# Patient Record
Sex: Male | Born: 1955 | Race: Black or African American | Hispanic: No | Marital: Single | Smoking: Light tobacco smoker
Health system: Southern US, Community
[De-identification: ages and names within clinical notes are randomized; demographics above are authoritative.]

## PROBLEM LIST (undated history)

## (undated) DIAGNOSIS — E785 Hyperlipidemia, unspecified: Secondary | ICD-10-CM

## (undated) DIAGNOSIS — I10 Essential (primary) hypertension: Secondary | ICD-10-CM

## (undated) DIAGNOSIS — E119 Type 2 diabetes mellitus without complications: Secondary | ICD-10-CM

---

## 2017-12-09 ENCOUNTER — Emergency Department: Payer: Managed Care, Other (non HMO)

## 2017-12-09 ENCOUNTER — Other Ambulatory Visit: Payer: Self-pay

## 2017-12-09 ENCOUNTER — Emergency Department
Admission: EM | Admit: 2017-12-09 | Discharge: 2017-12-10 | Disposition: A | Discharge status: Home / Self Care | Payer: Managed Care, Other (non HMO) | Attending: Emergency Medicine | Admitting: Emergency Medicine

## 2017-12-09 DIAGNOSIS — T671XXA Heat syncope, initial encounter: Secondary | ICD-10-CM | POA: Insufficient documentation

## 2017-12-09 DIAGNOSIS — F1210 Cannabis abuse, uncomplicated: Secondary | ICD-10-CM | POA: Diagnosis not present

## 2017-12-09 DIAGNOSIS — E860 Dehydration: Secondary | ICD-10-CM | POA: Insufficient documentation

## 2017-12-09 DIAGNOSIS — I10 Essential (primary) hypertension: Secondary | ICD-10-CM | POA: Diagnosis not present

## 2017-12-09 DIAGNOSIS — Z79899 Other long term (current) drug therapy: Secondary | ICD-10-CM | POA: Diagnosis not present

## 2017-12-09 DIAGNOSIS — E119 Type 2 diabetes mellitus without complications: Secondary | ICD-10-CM | POA: Insufficient documentation

## 2017-12-09 DIAGNOSIS — R55 Syncope and collapse: Secondary | ICD-10-CM | POA: Diagnosis present

## 2017-12-09 DIAGNOSIS — Z7984 Long term (current) use of oral hypoglycemic drugs: Secondary | ICD-10-CM | POA: Diagnosis not present

## 2017-12-09 LAB — BASIC METABOLIC PANEL
Anion gap: 10 (ref 5–15)
BUN: 23 mg/dL (ref 8–23)
CALCIUM: 9 mg/dL (ref 8.9–10.3)
CHLORIDE: 107 mmol/L (ref 98–111)
CO2: 23 mmol/L (ref 22–32)
CREATININE: 1.48 mg/dL — AB (ref 0.61–1.24)
GFR calc non Af Amer: 49 mL/min — ABNORMAL LOW (ref >60)
GFR, EST AFRICAN AMERICAN: 56 mL/min — AB (ref >60)
Glucose, Bld: 167 mg/dL — ABNORMAL HIGH (ref 70–99)
Potassium: 3.4 mmol/L — ABNORMAL LOW (ref 3.5–5.1)
Sodium: 140 mmol/L (ref 135–145)

## 2017-12-09 LAB — URINE DRUG SCREEN, QUALITATIVE (ARMC ONLY)
AMPHETAMINES, UR SCREEN: NOT DETECTED
Benzodiazepine, Ur Scrn: NOT DETECTED
COCAINE METABOLITE, UR ~~LOC~~: NOT DETECTED
Cannabinoid 50 Ng, Ur ~~LOC~~: POSITIVE — AB
MDMA (ECSTASY) UR SCREEN: NOT DETECTED
METHADONE SCREEN, URINE: NOT DETECTED
Opiate, Ur Screen: NOT DETECTED
Phencyclidine (PCP) Ur S: NOT DETECTED
TRICYCLIC, UR SCREEN: NOT DETECTED

## 2017-12-09 LAB — CBC
HCT: 37.5 % — ABNORMAL LOW (ref 40.0–52.0)
Hemoglobin: 13.1 g/dL (ref 13.0–18.0)
MCH: 31.7 pg (ref 26.0–34.0)
MCHC: 34.9 g/dL (ref 32.0–36.0)
MCV: 90.8 fL (ref 80.0–100.0)
PLATELETS: 184 10*3/uL (ref 150–440)
RBC: 4.13 MIL/uL — ABNORMAL LOW (ref 4.40–5.90)
RDW: 13.7 % (ref 11.5–14.5)
WBC: 7 10*3/uL (ref 3.8–10.6)

## 2017-12-09 LAB — URINALYSIS, COMPLETE (UACMP) WITH MICROSCOPIC
Bacteria, UA: NONE SEEN
Bilirubin Urine: NEGATIVE
GLUCOSE, UA: NEGATIVE mg/dL
Hgb urine dipstick: NEGATIVE
Ketones, ur: NEGATIVE mg/dL
Leukocytes, UA: NEGATIVE
Nitrite: NEGATIVE
PH: 5 (ref 5.0–8.0)
Protein, ur: NEGATIVE mg/dL
SPECIFIC GRAVITY, URINE: 1.01 (ref 1.005–1.030)

## 2017-12-09 LAB — CK: CK TOTAL: 461 U/L — AB (ref 49–397)

## 2017-12-09 LAB — TROPONIN I: TROPONIN I: 0.03 ng/mL — AB (ref <0.03)

## 2017-12-09 LAB — ETHANOL: Alcohol, Ethyl (B): <10 mg/dL (ref <10)

## 2017-12-09 MED ORDER — SODIUM CHLORIDE 0.9 % IV BOLUS
1000.0000 mL | Freq: Once | INTRAVENOUS | Status: AC
  Administered 2017-12-09: 1000 mL via INTRAVENOUS

## 2017-12-09 NOTE — ED Provider Notes (Signed)
[facility] Emergency Department Provider Note   ____________________________________________   First MD Initiated Contact with Patient 12/09/17 2250     (approximate)  I have reviewed the triage vital signs and the nursing notes.   HISTORY  Chief Complaint Loss of Consciousness    HPI David Werner is a 62 y.o. male brought to the ED from home via EMS with a chief complaint of syncope.  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  At 7 AM he mowed his lawn with a riding lawnmower.  Took a shower and then played 9 holes of golf in the heat of the afternoon.  Went home and had a beer and 2 glasses of wine and started the grill.  He was sitting down when he felt lightheaded and dizzy accompanied by nausea.  He stood up to go indoors into the air conditioning and had a brief syncopal episode.  His family members managed to catch him so he did not strike his head.  Denies associated chest pain, shortness of breath, abdominal pain, vomiting, diarrhea.  EMS reports patient was orthostatic on their arrival.  Patient denies anticoagulant use.  Denies recent travel or trauma.   Past medical history Hypertension Hyperlipidemia Diabetes GERD  There are no active problems to display for this patient.    Prior to Admission medications   Medication Sig Start Date End Date Taking? Authorizing Provider  gabapentin (NEURONTIN) 400 MG capsule Take 400 mg by mouth 5 (five) times daily.   Yes [provider]  ibuprofen (ADVIL,MOTRIN) 800 MG tablet Take 800 mg by mouth 3 (three) times daily as needed for pain.   Yes [provider]  metFORMIN (GLUCOPHAGE) 500 MG tablet Take 500 mg by mouth daily with breakfast.   Yes [provider]  olmesartan (BENICAR) 20 MG tablet Take 20 mg by mouth daily.   Yes [provider]  omeprazole (PRILOSEC) 20 MG capsule Take 20 mg by mouth  daily.   Yes [provider]  rosuvastatin (CRESTOR) 5 MG tablet Take 5 mg by mouth at bedtime.   Yes [provider]    Allergies Patient has no known allergies.  No family history on file.  Social History Social History   Tobacco Use  . Smoking status: Not on file  Substance Use Topics  . Alcohol use: Not on file  . Drug use: Not on file  + EtOH + Marijuana use this evening  Review of Systems  Constitutional: No fever/chills Eyes: No visual changes. ENT: No sore throat. Cardiovascular: Denies chest pain. Respiratory: Denies shortness of breath. Gastrointestinal: No abdominal pain.  No nausea, no vomiting.  No diarrhea.  No constipation. Genitourinary: Negative for dysuria. Musculoskeletal: Negative for back pain. Skin: Negative for rash. Neurological: Positive for syncope.  Negative for headaches, focal weakness or numbness.   ____________________________________________   PHYSICAL EXAM:  VITAL SIGNS: ED Triage Vitals  Enc Vitals Group     BP 12/09/17 2126 (!) 110/59     Pulse Rate 12/09/17 2126 64     Resp 12/09/17 2126 18     Temp 12/09/17 2126 98.2 F (36.8 C)     Temp Source 12/09/17 2126 Oral     SpO2 12/09/17 2126 98 %     Weight 12/09/17 2127 240 lb (108.9 kg)     Height 12/09/17 2127 6\' 3"  (1.905 m)     Head Circumference --  Peak Flow --      Pain Score 12/09/17 2127 0     Pain Loc --      Pain Edu? --      Excl. in GC? --     Constitutional: Alert and oriented. Well appearing and in no acute distress.  Pleasant. Eyes: Conjunctivae are normal. PERRL. EOMI. Head: Atraumatic. Nose: No congestion/rhinnorhea. Mouth/Throat: Mucous membranes are moist.  Oropharynx non-erythematous. Neck: No stridor.  No cervical spine tenderness to palpation.  No carotid bruits.  Supple neck without meningismus. Cardiovascular: Normal rate, regular rhythm. Grossly normal heart sounds.  Good peripheral circulation. Respiratory: Normal  respiratory effort.  No retractions. Lungs CTAB. Gastrointestinal: Soft and nontender. No distention. No abdominal bruits. No CVA tenderness. Musculoskeletal: No lower extremity tenderness nor edema.  No joint effusions. Neurologic:  Normal speech and language. No gross focal neurologic deficits are appreciated.  Skin:  Skin is warm, dry and intact. No rash noted. Psychiatric: Mood and affect are normal. Speech and behavior are normal.  ____________________________________________   LABS (all labs ordered are listed, but only abnormal results are displayed)  Labs Reviewed  BASIC METABOLIC PANEL - Abnormal; Notable for the following components:      Result Value   Potassium 3.4 (*)    Glucose, Bld 167 (*)    Creatinine, Ser 1.48 (*)    GFR calc non Af Amer 49 (*)    GFR calc Af Amer 56 (*)    All other components within normal limits  CBC - Abnormal; Notable for the following components:   RBC 4.13 (*)    HCT 37.5 (*)    All other components within normal limits  URINALYSIS, COMPLETE (UACMP) WITH MICROSCOPIC - Abnormal; Notable for the following components:   Color, Urine YELLOW (*)    APPearance CLEAR (*)    All other components within normal limits  TROPONIN I - Abnormal; Notable for the following components:   Troponin I 0.03 (*)    All other components within normal limits  URINE DRUG SCREEN, QUALITATIVE ([facility] ONLY) - Abnormal; Notable for the following components:   Cannabinoid 50 Ng, Ur Palmer POSITIVE (*)    Barbiturates, Ur Screen   (*)    Value: Result not available. Reagent lot number recalled by manufacturer.   All other components within normal limits  CK - Abnormal; Notable for the following components:   Total CK 461 (*)    All other components within normal limits  TROPONIN I - Abnormal; Notable for the following components:   Troponin I 0.03 (*)    All other components within normal limits  ETHANOL   ____________________________________________  EKG  ED ECG  REPORT I, Jenetta Wease J, the attending physician, personally viewed and interpreted this ECG.   Date: 12/10/2017  EKG Time: 2126  Rate: 66  Rhythm: normal EKG, normal sinus rhythm  Axis: Normal  Intervals:none  ST&T Change: Nonspecific  ____________________________________________  RADIOLOGY  ED MD interpretation: CT head demonstrates no ICH;   Official radiology report(s): Ct Head Wo Contrast  Result Date: 12/10/2017 CLINICAL DATA:  Syncope and hypotension EXAM: CT HEAD WITHOUT CONTRAST TECHNIQUE: Contiguous axial images were obtained from the base of the skull through the vertex without intravenous contrast. COMPARISON:  None. FINDINGS: Brain: There is no mass, hemorrhage or extra-axial collection. The size and configuration of the ventricles and extra-axial CSF spaces are normal. There is no acute or chronic infarction. The brain parenchyma is normal. Vascular: No abnormal hyperdensity of the major intracranial  arteries or dural venous sinuses. No intracranial atherosclerosis. Skull: The visualized skull base, calvarium and extracranial soft tissues are normal. Sinuses/Orbits: No fluid levels or advanced mucosal thickening of the visualized paranasal sinuses. No mastoid or middle ear effusion. The orbits are normal. IMPRESSION: Normal head CT. Electronically Signed   By: Deatra Robinson M.D.   On: 12/10/2017 00:01   Dg Chest Port 1 View  Result Date: 12/10/2017 CLINICAL DATA:  Syncope EXAM: PORTABLE CHEST 1 VIEW COMPARISON:  None. FINDINGS: The heart size and mediastinal contours are within normal limits. Both lungs are clear. The visualized skeletal structures are unremarkable. IMPRESSION: No active disease. Electronically Signed   By: Deatra Robinson M.D.   On: 12/10/2017 00:12    ____________________________________________   PROCEDURES  Procedure(s) performed: None  Procedures  Critical Care performed: No  ____________________________________________   INITIAL IMPRESSION /  ASSESSMENT AND PLAN / ED COURSE  As part of my medical decision making, I reviewed the following data within the electronic MEDICAL RECORD NUMBER Nursing notes reviewed and incorporated, Labs reviewed, EKG interpreted, Old chart reviewed, Radiograph reviewed and Notes from prior ED visits   62 year old male with diabetes, hypertension, hyperlipidemia who presents with syncopal episode in the setting of heat illness.  Differential diagnosis includes but is not limited to CVA, CAD, MI, dehydration, orthostasis, heat syncope, etc.  Initial laboratory urinalysis results remarkable for dehydration, borderline troponin, positive cannabinoids in urine.  CK is minimally elevated.  EtOH is negative.  Patient feels significantly better and had normal orthostatics after 1.5 L IV normal saline.  Will obtain CT head, chest x-ray, repeat troponin and reassess.  Clinical Course as of Dec 11 455  Sun Dec 10, 2017  0117 Repeat troponin unchanged.  Updated patient of CT and x-ray results.  He is overall feeling significantly better and very eager to be discharged home.  Strict return precautions given.  Patient verbalizes understanding and agrees with plan of care.   [JS]    Clinical Course User Index [JS] Irean Hong, MD     ____________________________________________   FINAL CLINICAL IMPRESSION(S) / ED DIAGNOSES  Final diagnoses:  Heat syncope, initial encounter  Dehydration  Marijuana abuse     ED Discharge Orders    None       Note:  This document was prepared using Dragon voice recognition software and may include unintentional dictation errors.    Irean Hong, MD 12/10/17 [phone]

## 2017-12-09 NOTE — ED Triage Notes (Signed)
EMS pt to rm 4 from home with report of syncopal episode this evening. Pt reports he mowed his yard this morning, then played 9 hole of golf. Went home and had two glasses of wine and was grilling out when he passed out by the grill. Denies injury. EMS gave 500 ml of ns bolus. Pt BP was 103/ while laying on the deck and then 89/ when he stood.

## 2017-12-10 LAB — TROPONIN I: Troponin I: 0.03 ng/mL (ref <0.03)

## 2017-12-10 NOTE — Discharge Instructions (Addendum)
1.  Stay in air conditioning and drink plenty of fluids this weekend. 2.  Return to the ER for worsening symptoms, persistent vomiting, difficulty breathing or other concerns.

## 2019-01-28 IMAGING — CT CT HEAD W/O CM
3 series · 16 of 47 positions shown, 19 images · non-contrast
Comparison: None.

CLINICAL DATA: Syncope and hypotension

EXAM:
CT HEAD WITHOUT CONTRAST
TECHNIQUE: Contiguous axial images were obtained from the base of the skull
through the vertex without intravenous contrast.

[Series 2: head wo · axial · 0.47mm/px · z∈[-142,-17]mm · 10 of 30 slices shown, 13 images]
[im 3/30  brain]
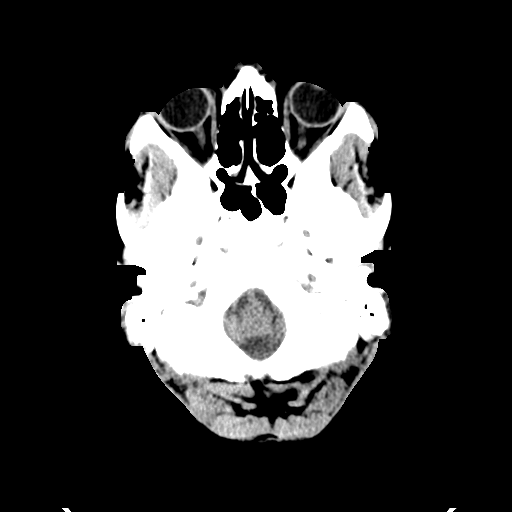
[im 3/30  bone]
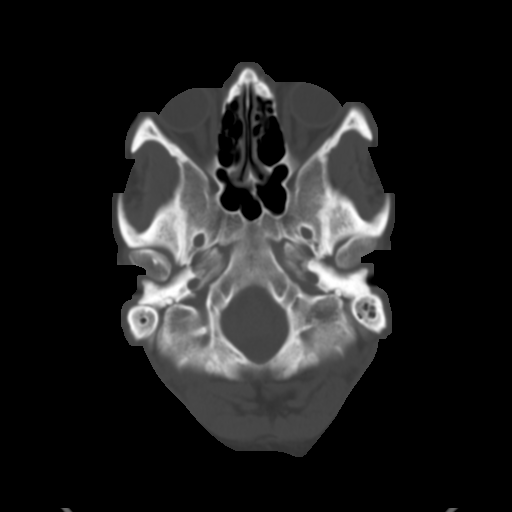
[im 6/30  brain]
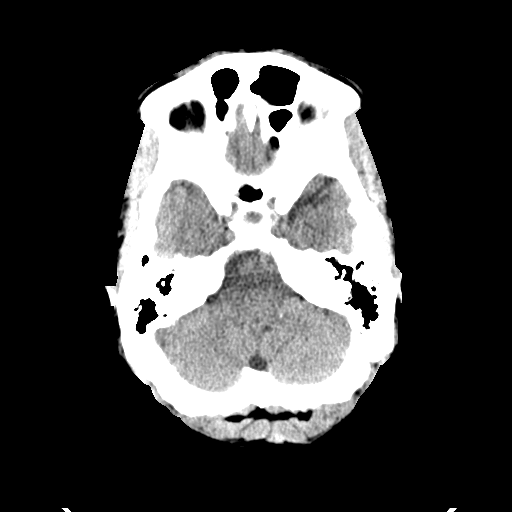
[im 9/30  brain]
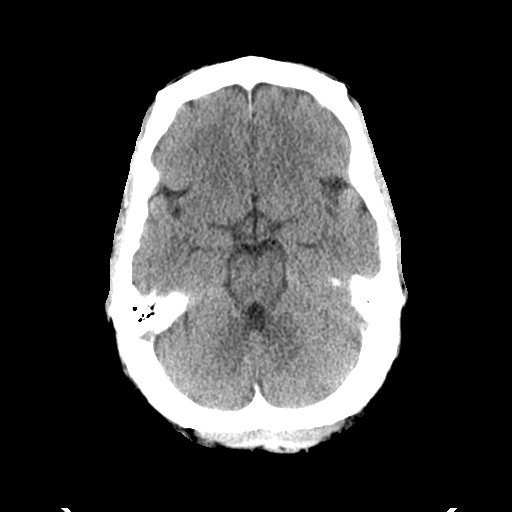
[im 11/30  brain]
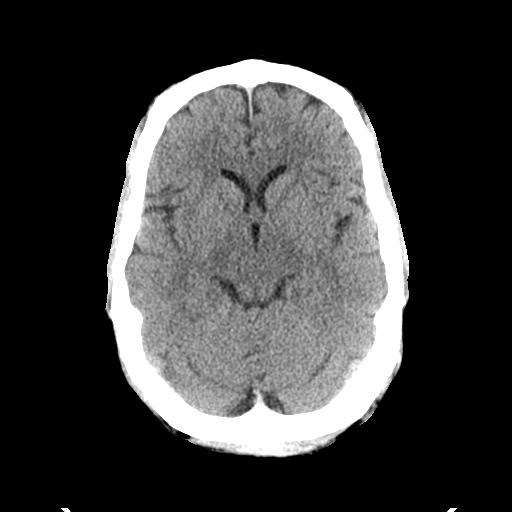
[im 14/30  brain]
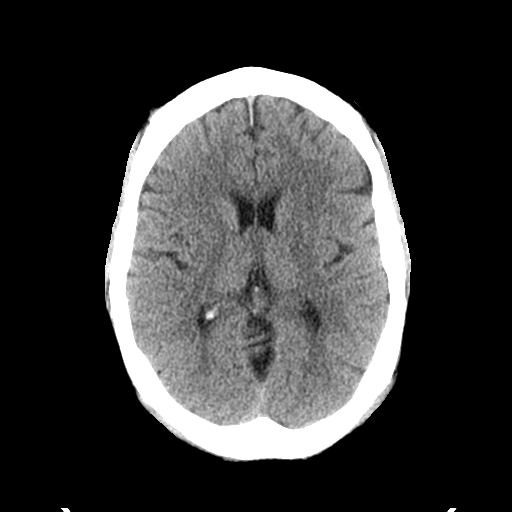
[im 14/30  bone]
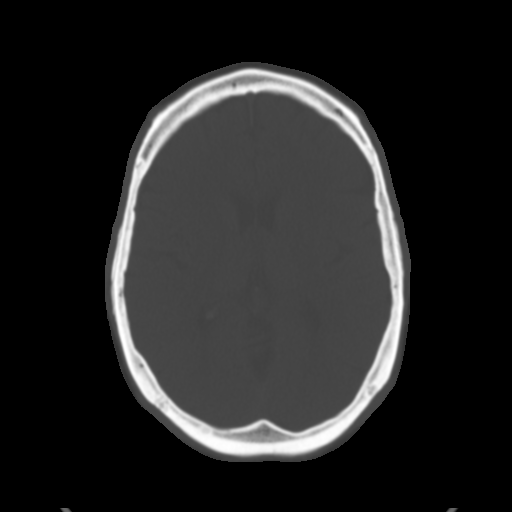
[im 17/30  brain]
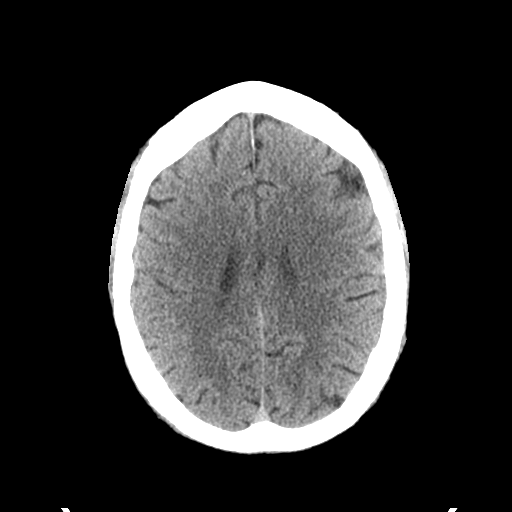
[im 20/30  brain]
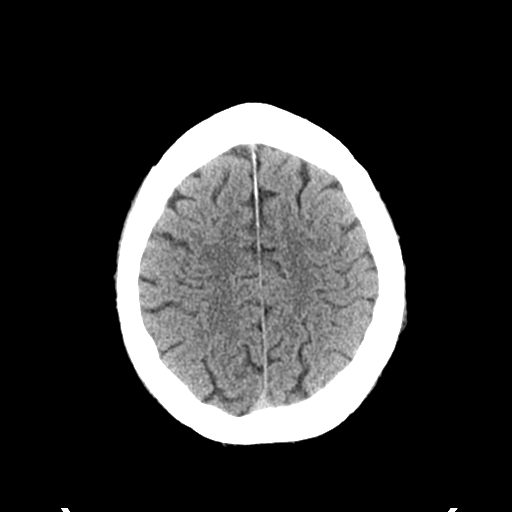
[im 23/30  brain]
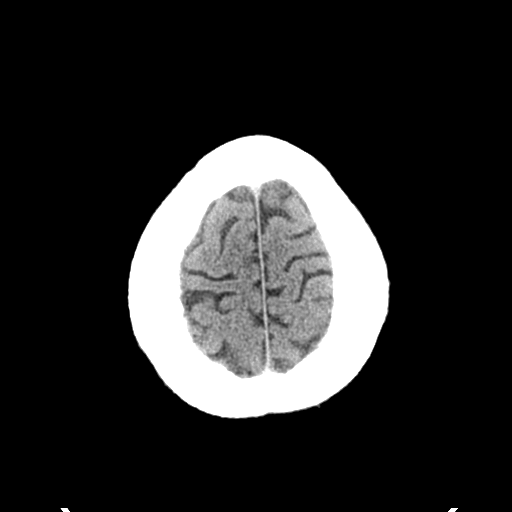
[im 25/30  brain]
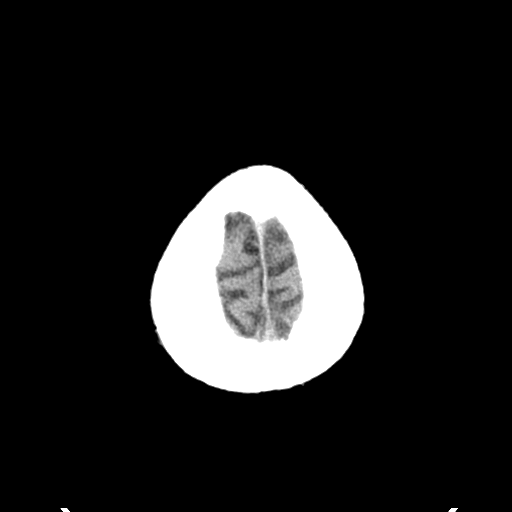
[im 25/30  bone]
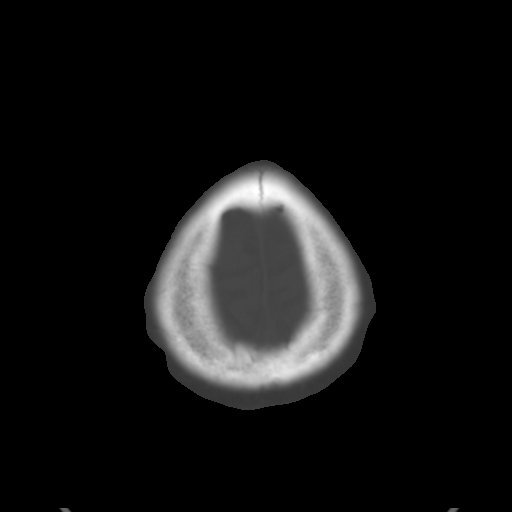
[im 28/30  brain]
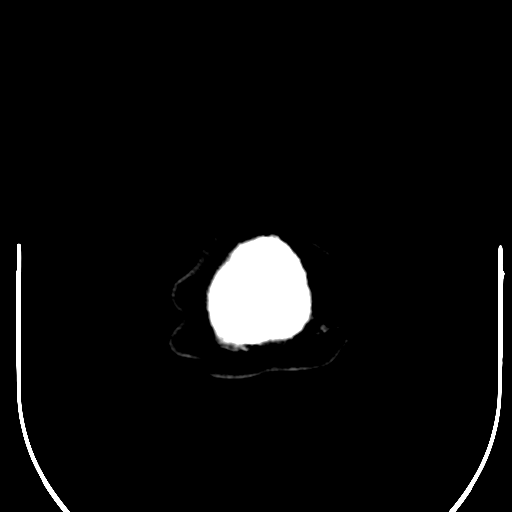

[Series 4: coronal soft tissue · coronal · 0.31mm/px · 3 of 68 slices shown]
[im 23/68  brain]
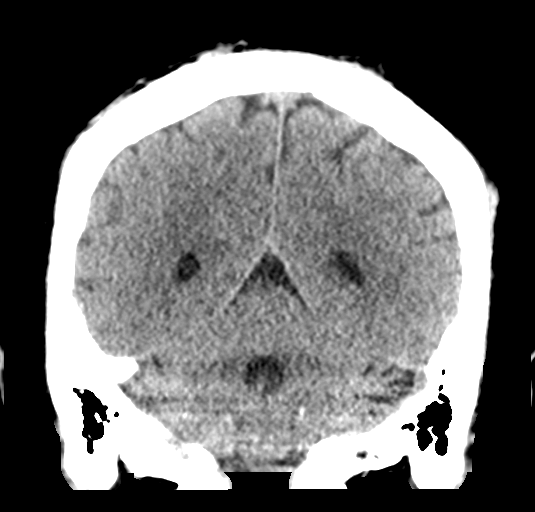
[im 30/68  brain]
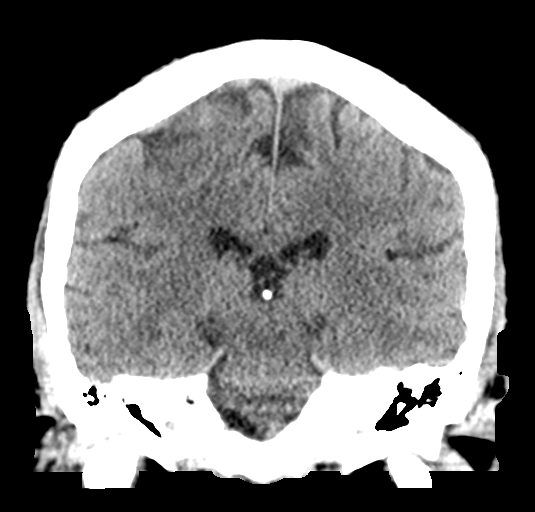
[im 38/68  brain]
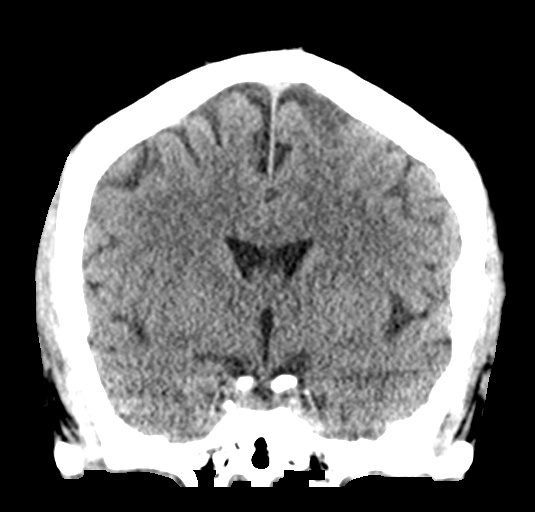

[Series 5: sagittal soft tissue · sagittal · 0.31mm/px · 3 of 55 slices shown]
[im 19/55  brain]
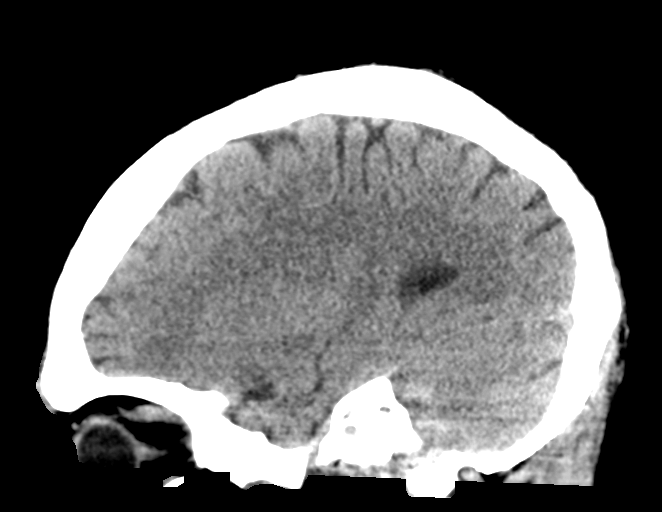
[im 28/55  brain]
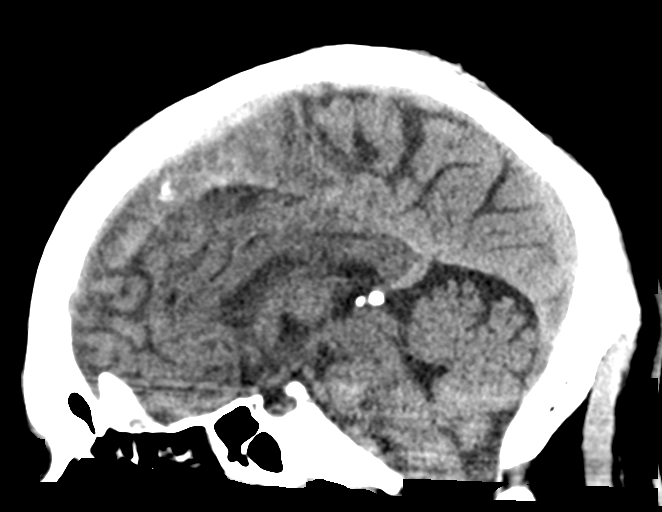
[im 37/55  brain]
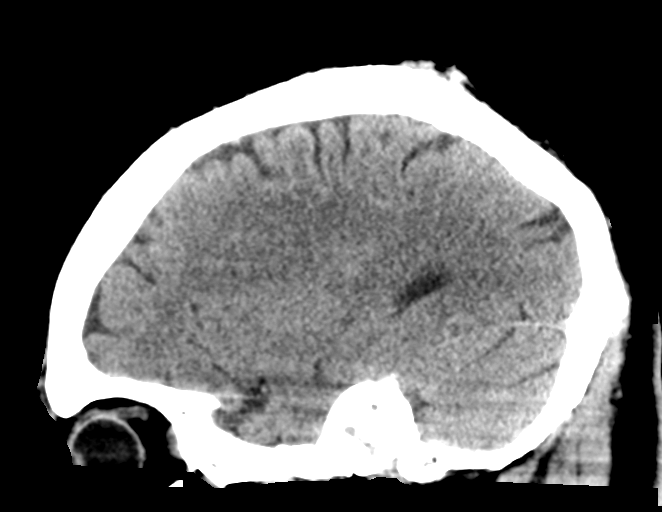

[16 of 47 positions shown; findings below may reference images not displayed]

FINDINGS: Brain: There is no mass, hemorrhage or extra-axial collection. The
size and configuration of the ventricles and extra-axial CSF spaces
are normal. There is no acute or chronic infarction. The brain
parenchyma is normal.

Vascular: No abnormal hyperdensity of the major intracranial
arteries or dural venous sinuses. No intracranial atherosclerosis.

Skull: The visualized skull base, calvarium and extracranial soft
tissues are normal.

Sinuses/Orbits: No fluid levels or advanced mucosal thickening of
the visualized paranasal sinuses. No mastoid or middle ear effusion.
The orbits are normal.
IMPRESSION: Normal head CT.

## 2019-07-28 ENCOUNTER — Other Ambulatory Visit: Payer: Self-pay

## 2019-07-28 ENCOUNTER — Encounter: Payer: Self-pay | Admitting: Emergency Medicine

## 2019-07-28 ENCOUNTER — Emergency Department: Payer: 59

## 2019-07-28 ENCOUNTER — Emergency Department
Admission: EM | Admit: 2019-07-28 | Discharge: 2019-07-29 | Disposition: A | Discharge status: Home / Self Care | Payer: 59 | Attending: Emergency Medicine | Admitting: Emergency Medicine

## 2019-07-28 DIAGNOSIS — R6883 Chills (without fever): Secondary | ICD-10-CM | POA: Insufficient documentation

## 2019-07-28 DIAGNOSIS — Z79899 Other long term (current) drug therapy: Secondary | ICD-10-CM | POA: Diagnosis not present

## 2019-07-28 DIAGNOSIS — R0602 Shortness of breath: Secondary | ICD-10-CM | POA: Diagnosis not present

## 2019-07-28 DIAGNOSIS — I10 Essential (primary) hypertension: Secondary | ICD-10-CM | POA: Insufficient documentation

## 2019-07-28 DIAGNOSIS — N3090 Cystitis, unspecified without hematuria: Secondary | ICD-10-CM | POA: Diagnosis not present

## 2019-07-28 DIAGNOSIS — R531 Weakness: Secondary | ICD-10-CM | POA: Insufficient documentation

## 2019-07-28 DIAGNOSIS — Z7984 Long term (current) use of oral hypoglycemic drugs: Secondary | ICD-10-CM | POA: Diagnosis not present

## 2019-07-28 DIAGNOSIS — F17200 Nicotine dependence, unspecified, uncomplicated: Secondary | ICD-10-CM | POA: Insufficient documentation

## 2019-07-28 DIAGNOSIS — Z20822 Contact with and (suspected) exposure to covid-19: Secondary | ICD-10-CM | POA: Insufficient documentation

## 2019-07-28 DIAGNOSIS — R110 Nausea: Secondary | ICD-10-CM

## 2019-07-28 DIAGNOSIS — E119 Type 2 diabetes mellitus without complications: Secondary | ICD-10-CM | POA: Insufficient documentation

## 2019-07-28 HISTORY — DX: Essential (primary) hypertension: I10

## 2019-07-28 HISTORY — DX: Hyperlipidemia, unspecified: E78.5

## 2019-07-28 HISTORY — DX: Type 2 diabetes mellitus without complications: E11.9

## 2019-07-28 LAB — HEPATIC FUNCTION PANEL
ALT: 40 U/L (ref 0–44)
AST: 30 U/L (ref 15–41)
Albumin: 3.8 g/dL (ref 3.5–5.0)
Alkaline Phosphatase: 49 U/L (ref 38–126)
Bilirubin, Direct: 0.1 mg/dL (ref 0.0–0.2)
Indirect Bilirubin: 0.6 mg/dL (ref 0.3–0.9)
Total Bilirubin: 0.7 mg/dL (ref 0.3–1.2)
Total Protein: 7.5 g/dL (ref 6.5–8.1)

## 2019-07-28 LAB — URINALYSIS, COMPLETE (UACMP) WITH MICROSCOPIC
Bacteria, UA: NONE SEEN
Bilirubin Urine: NEGATIVE
Glucose, UA: NEGATIVE mg/dL
Hgb urine dipstick: NEGATIVE
Ketones, ur: NEGATIVE mg/dL
Nitrite: NEGATIVE
Protein, ur: 30 mg/dL — AB
Specific Gravity, Urine: 1.017 (ref 1.005–1.030)
Squamous Epithelial / HPF: NONE SEEN (ref 0–5)
pH: 5 (ref 5.0–8.0)

## 2019-07-28 LAB — CBC
HCT: 39.4 % (ref 39.0–52.0)
Hemoglobin: 13.9 g/dL (ref 13.0–17.0)
MCH: 31.1 pg (ref 26.0–34.0)
MCHC: 35.3 g/dL (ref 30.0–36.0)
MCV: 88.1 fL (ref 80.0–100.0)
Platelets: 343 10*3/uL (ref 150–400)
RBC: 4.47 MIL/uL (ref 4.22–5.81)
RDW: 12.2 % (ref 11.5–15.5)
WBC: 7.3 10*3/uL (ref 4.0–10.5)
nRBC: 0 % (ref 0.0–0.2)

## 2019-07-28 LAB — BASIC METABOLIC PANEL
Anion gap: 14 (ref 5–15)
BUN: 27 mg/dL — ABNORMAL HIGH (ref 8–23)
CO2: 19 mmol/L — ABNORMAL LOW (ref 22–32)
Calcium: 9.4 mg/dL (ref 8.9–10.3)
Chloride: 107 mmol/L (ref 98–111)
Creatinine, Ser: 1.45 mg/dL — ABNORMAL HIGH (ref 0.61–1.24)
GFR calc Af Amer: 59 mL/min — ABNORMAL LOW (ref >60)
GFR calc non Af Amer: 51 mL/min — ABNORMAL LOW (ref >60)
Glucose, Bld: 148 mg/dL — ABNORMAL HIGH (ref 70–99)
Potassium: 3.6 mmol/L (ref 3.5–5.1)
Sodium: 140 mmol/L (ref 135–145)

## 2019-07-28 LAB — LIPASE, BLOOD: Lipase: 16 U/L (ref 11–51)

## 2019-07-28 MED ORDER — SODIUM CHLORIDE 0.9 % IV BOLUS
1000.0000 mL | Freq: Once | INTRAVENOUS | Status: AC
  Administered 2019-07-28: 1000 mL via INTRAVENOUS

## 2019-07-28 MED ORDER — ONDANSETRON HCL 4 MG/2ML IJ SOLN
4.0000 mg | Freq: Once | INTRAMUSCULAR | Status: AC
  Administered 2019-07-28: 4 mg via INTRAVENOUS
  Filled 2019-07-28: qty 2

## 2019-07-28 MED ORDER — SODIUM CHLORIDE 0.9% FLUSH: 3.0000 mL | Freq: Once | INTRAVENOUS | Status: DC

## 2019-07-28 NOTE — ED Notes (Signed)
Called lab to add blood specimens on to already sent blood work.

## 2019-07-28 NOTE — ED Triage Notes (Signed)
PT to ER states nausea, chills and weakness.  States was seen at PCP earlier this week and is being treated for a UTI.  Pt states symptoms started after he started taking Abx.

## 2019-07-28 NOTE — ED Provider Notes (Signed)
[facility] Emergency Department Provider Note  ____________________________________________   None    (approximate)  I have reviewed the triage vital signs and the nursing notes.   HISTORY  Chief Complaint Weakness and Nausea    HPI David Werner is a 64 y.o. male with diabetes, hypertension, hyperlipidemia who comes in for nausea, chills and weakness.  Patient is currently getting treated for UTI.  Patient stated on Tuesday that he was having burning when he pees increased urinary frequency.  He went to his primary care doctor who tested his urine and it was grossly infected and started on ciprofloxacin.  Since being started the ciprofloxacin he has had some worsening nausea.  No vomiting but just decreased p.o. intake.  He also endorses some weakness.  He thinks is just from not eating and drinking as much.  His nausea is intermittent, nothing makes it better, nothing makes it worse.  His urinary symptoms are getting a lot better.  He had his Covid vaccines x2.  Said he had up a little bit of shortness of breath yesterday but not so much today.  No coughing.  Does have some chills.  He denies any chest pain or back pain          Past Medical History:  Diagnosis Date  . Diabetes mellitus without complication (HCC)   . Hyperlipidemia   . Hypertension     There are no problems to display for this patient.   History reviewed. No pertinent surgical history.  Prior to Admission medications   Medication Sig Start Date End Date Taking? Authorizing Provider  gabapentin (NEURONTIN) 400 MG capsule Take 400 mg by mouth 5 (five) times daily.    [provider]  ibuprofen (ADVIL,MOTRIN) 800 MG tablet Take 800 mg by mouth 3 (three) times daily as needed for pain.    [provider]  metFORMIN (GLUCOPHAGE) 500 MG tablet Take 500 mg by mouth daily with breakfast.    [provider]  olmesartan (BENICAR) 20 MG tablet Take 20 mg by  mouth daily.    [provider]  omeprazole (PRILOSEC) 20 MG capsule Take 20 mg by mouth daily.    [provider]  rosuvastatin (CRESTOR) 5 MG tablet Take 5 mg by mouth at bedtime.    [provider]    Allergies Patient has no known allergies.  History reviewed. No pertinent family history.  Social History Social History   Tobacco Use  . Smoking status: Light Tobacco Smoker  . Smokeless tobacco: Never Used  Substance Use Topics  . Alcohol use: Not on file  . Drug use: Not on file      Review of Systems Constitutional: Positive chills Eyes: No visual changes. ENT: No sore throat. Cardiovascular: Denies chest pain. Respiratory: Mild shortness of breath yesterday Gastrointestinal: No abdominal pain.  Positive nausea no constipation. Genitourinary: Negative for dysuria. Musculoskeletal: Negative for back pain. Skin: Negative for rash. Neurological: Negative for headaches, focal weakness or numbness. All other ROS negative ____________________________________________   PHYSICAL EXAM:  VITAL SIGNS: ED Triage Vitals  Enc Vitals Group     BP 07/28/19 1749 123/83     Pulse Rate 07/28/19 1749 (!) 103     Resp 07/28/19 1749 18     Temp 07/28/19 1749 99 F (37.2 C)     Temp Source 07/28/19 1749 Oral     SpO2 07/28/19 1749 100 %     Weight 07/28/19 1750 206 lb (93.4 kg)  Height 07/28/19 1750 6' (1.829 m)     Head Circumference --      Peak Flow --      Pain Score 07/28/19 1750 0     Pain Loc --      Pain Edu? --      Excl. in GC? --     Constitutional: Alert and oriented. Well appearing and in no acute distress. Eyes: Conjunctivae are normal. EOMI. Head: Atraumatic. Nose: No congestion/rhinnorhea. Mouth/Throat: Mucous membranes are moist.   Neck: No stridor. Trachea Midline. FROM Cardiovascular: Normal rate, regular rhythm. Grossly normal heart sounds.  Good peripheral circulation. Respiratory: Normal respiratory effort.  No  retractions. Lungs CTAB. Gastrointestinal: Soft and nontender. No distention. No abdominal bruits.  Musculoskeletal: No lower extremity tenderness nor edema.  No joint effusions. Neurologic:  Normal speech and language. No gross focal neurologic deficits are appreciated.  Skin:  Skin is warm, dry and intact. No rash noted. Psychiatric: Mood and affect are normal. Speech and behavior are normal. GU: Deferred   ____________________________________________   LABS (all labs ordered are listed, but only abnormal results are displayed)  Labs Reviewed  BASIC METABOLIC PANEL - Abnormal; Notable for the following components:      Result Value   CO2 19 (*)    Glucose, Bld 148 (*)    BUN 27 (*)    Creatinine, Ser 1.45 (*)    GFR calc non Af Amer 51 (*)    GFR calc Af Amer 59 (*)    All other components within normal limits  URINALYSIS, COMPLETE (UACMP) WITH MICROSCOPIC - Abnormal; Notable for the following components:   Color, Urine YELLOW (*)    APPearance CLEAR (*)    Protein, ur 30 (*)    Leukocytes,Ua TRACE (*)    All other components within normal limits  URINE CULTURE  CBC  HEPATIC FUNCTION PANEL  LIPASE, BLOOD  CBG MONITORING, ED   ____________________________________________   ED ECG REPORT I, Concha Se, the attending physician, personally viewed and interpreted this ECG.  EKG is sinus bradycardia rate of 57, no ST elevation, no T wave inversions, normal intervals ____________________________________________  RADIOLOGY Vela Prose, personally viewed and evaluated these images (plain radiographs) as part of my medical decision making, as well as reviewing the written report by the radiologist.  ED MD interpretation: No pneumonia noted  Official radiology report(s): DG Chest Portable 1 View  Result Date: 07/28/2019 CLINICAL DATA:  Nausea, chills and weakness.  Shortness of breath. EXAM: PORTABLE CHEST 1 VIEW COMPARISON:  12/09/2017 FINDINGS: The heart size and  mediastinal contours are within normal limits. Both lungs are clear. The visualized skeletal structures are unremarkable. IMPRESSION: No active disease. Electronically Signed   By: Paulina Fusi M.D.   On: 07/28/2019 23:46    ____________________________________________   PROCEDURES  Procedure(s) performed (including Critical Care):  Procedures   ____________________________________________   INITIAL IMPRESSION / ASSESSMENT AND PLAN / ED COURSE  Zyshonne Malecha was evaluated in Emergency Department on 07/28/2019 for the symptoms described in the history of present illness. He was evaluated in the context of the global COVID-19 pandemic, which necessitated consideration that the patient might be at risk for infection with the SARS-CoV-2 virus that causes COVID-19. Institutional protocols and algorithms that pertain to the evaluation of patients at risk for COVID-19 are in a state of rapid change based on information released by regulatory bodies including the CDC and federal and state organizations. These policies and algorithms were followed  during the patient's care in the ED.    Patient is a well-appearing 64 year old gentleman who is being treated for UTI with ciprofloxacin who comes in with nausea and weakness.  Patient is slightly tachycardic which I suspect is from his decreased p.o. intake.  Patient was given a liter of fluid.  We will give some IV Zofran for his nausea.  Labs were drawn to evaluate for AKI, electrolyte abnormalities, repeat urine to evaluate for resolution of his UTI.  Patient does endorse a little bit of shortness of breath yesterday with ambulation.  Will get chest x-ray just to make sure no signs of pneumonia.  Lower suspicion for Covid given he was vaccinated but will get tested.  Low suspicion for PE given not hypoxic and his other symptoms seem to be more likely related to viral or bacterial illness.  No chest pain to suggest ACS.  Patient does not appear bacteremic  or septic.   Kidney function is around baseline.  Patient was ordered 1 L of fluid in triage  White count is normal UA does have 11-12 WBCs but no bacteria he is already getting treatment.  Chest x-ray without evidence of pneumonia.  Reevaluated patient.  Patient feeling much better after the Zofran.  Patient feels comfortable with going home.  Patient is tolerating p.o.  Patient has a follow-up appointment on Tuesday.  Patient understands to quarantine until the Covid results comes back.  \______________________________________   FINAL CLINICAL IMPRESSION(S) / ED DIAGNOSES   Final diagnoses:  Cystitis  Nausea      MEDICATIONS GIVEN DURING THIS VISIT:  Medications  sodium chloride flush (NS) 0.9 % injection 3 mL (3 mLs Intravenous Not Given 07/28/19 2143)  sodium chloride 0.9 % bolus 1,000 mL (0 mLs Intravenous Stopped 07/28/19 2339)  ondansetron (ZOFRAN) injection 4 mg (4 mg Intravenous Given 07/28/19 2340)     ED Discharge Orders         Ordered    ondansetron (ZOFRAN ODT) 4 MG disintegrating tablet  Every 8 hours PRN     07/29/19 0019           Note:  This document was prepared using Dragon voice recognition software and may include unintentional dictation errors.   Vanessa Bobtown, MD 07/29/19 Quentin Mulling

## 2019-07-29 LAB — URINE CULTURE: Culture: NO GROWTH

## 2019-07-29 LAB — SARS CORONAVIRUS 2 (TAT 6-24 HRS): SARS Coronavirus 2: NEGATIVE

## 2019-07-29 MED ORDER — ONDANSETRON 4 MG PO TBDP: 4.0000 mg | ORAL_TABLET | Freq: Three times a day (TID) | ORAL | 0 refills | Status: AC | PRN

## 2019-07-29 NOTE — Discharge Instructions (Signed)
Take Zofran to help with nausea.  Can take Tylenol 1 g every 8 hours to help with fevers and chills.  Stay quarantined until Tuesday for your Covid results.  Return to the ER for worsening shortness of breath or any other concerns

## 2020-09-15 IMAGING — DX DG CHEST 1V PORT
2 series · 2 of 2 positions shown · non-contrast
Comparison: 12/09/2017

CLINICAL DATA: Nausea, chills and weakness.  Shortness of breath.

EXAM:
PORTABLE CHEST 1 VIEW

[chest ap (1 of 2)]
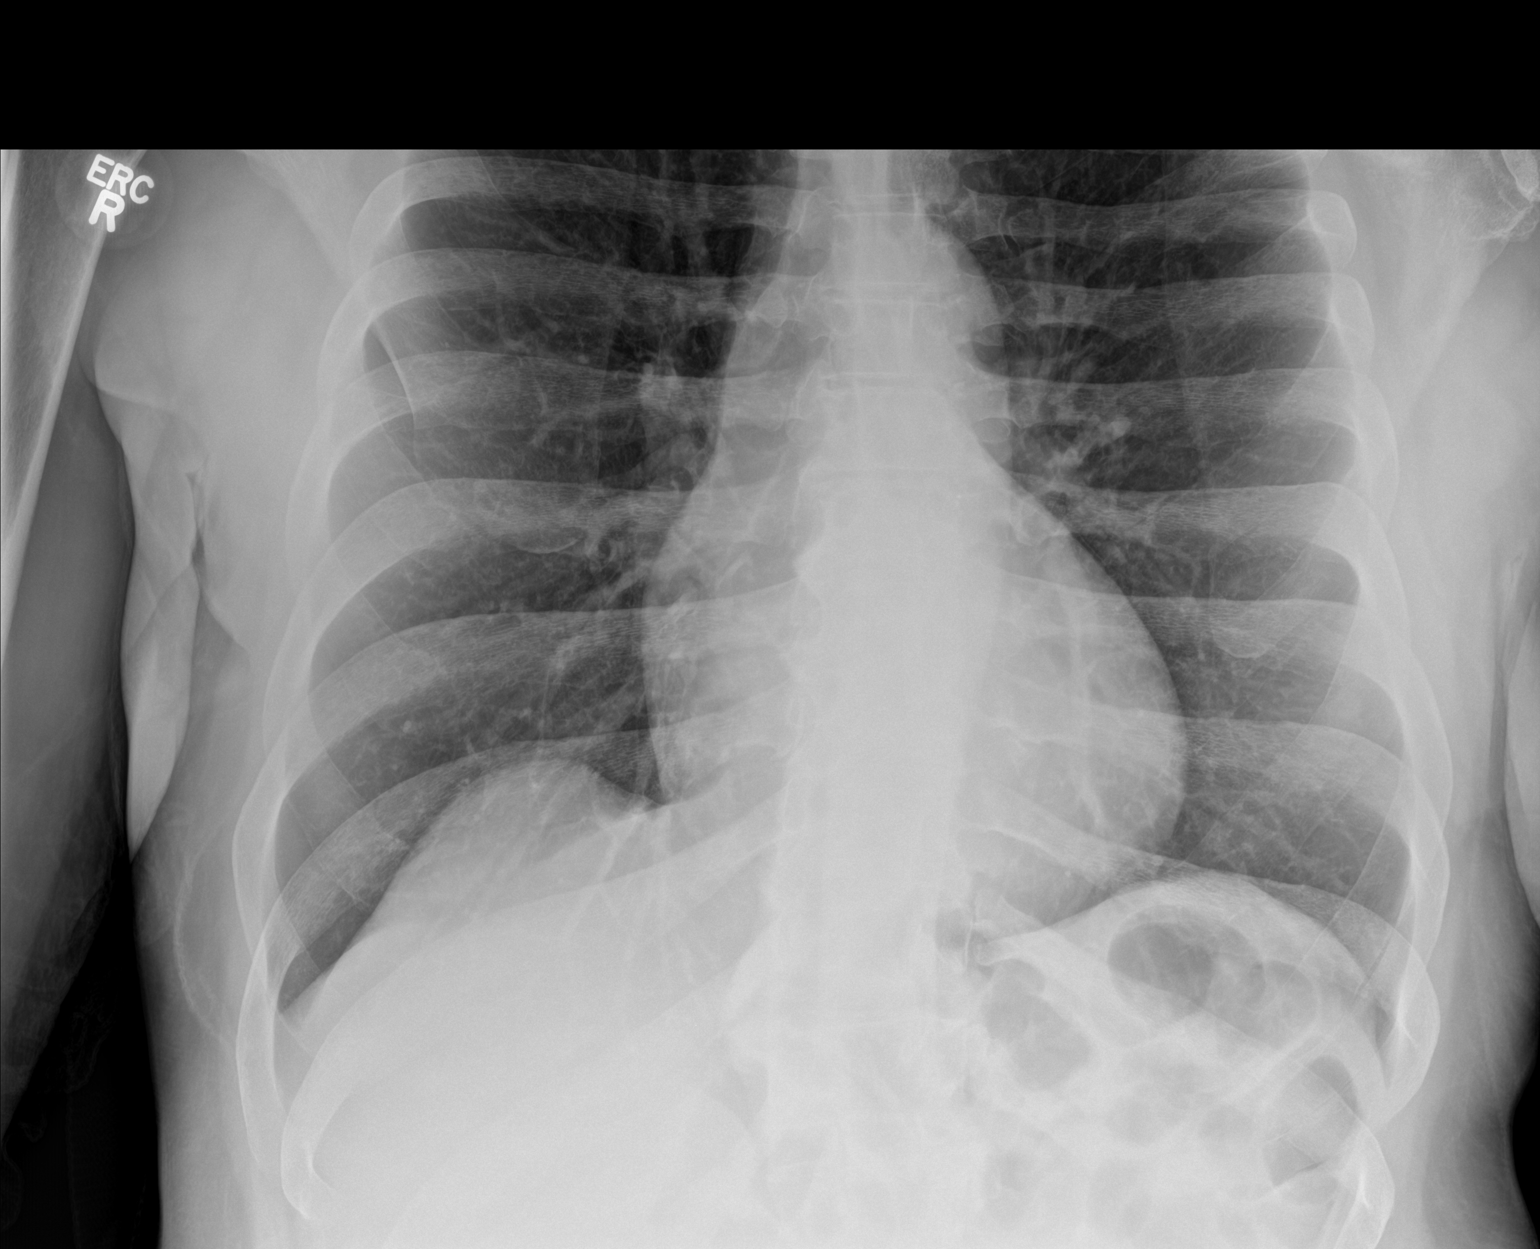

[chest ap (2 of 2)]
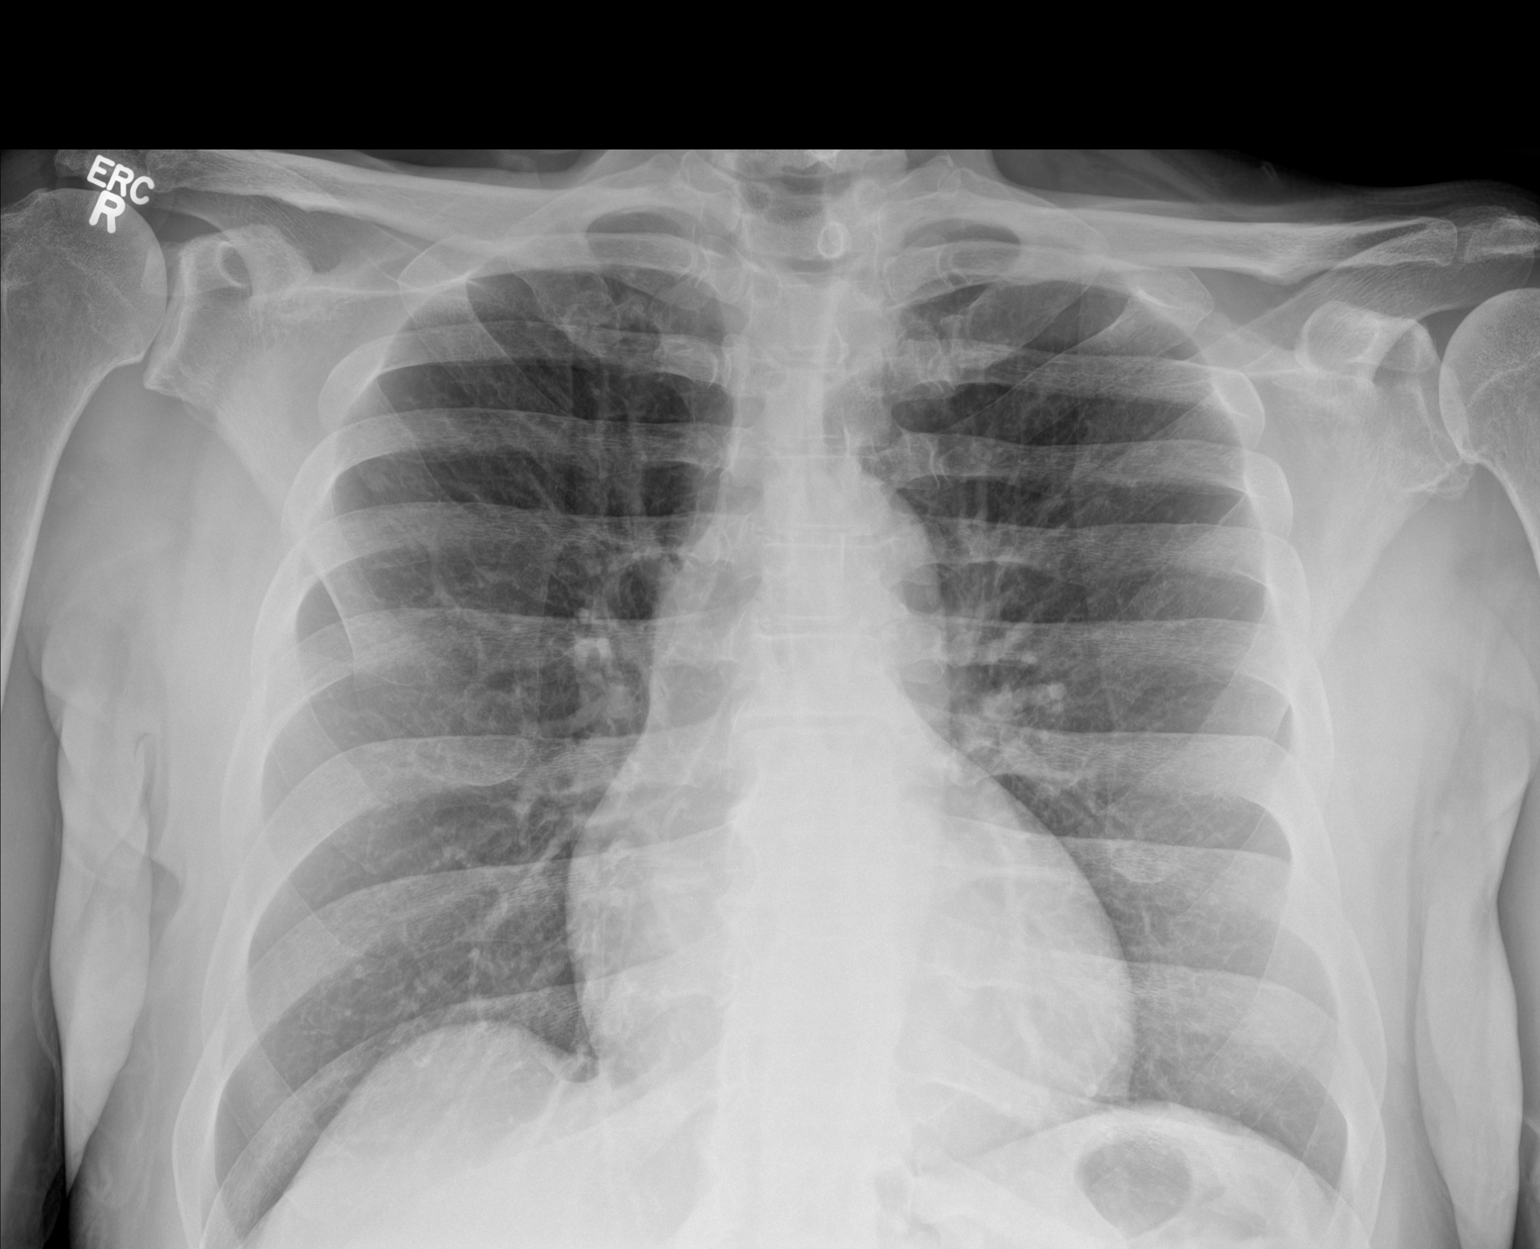

[2 of 2 positions shown; findings below may reference images not displayed]

FINDINGS: The heart size and mediastinal contours are within normal limits.
Both lungs are clear. The visualized skeletal structures are
unremarkable.
IMPRESSION: No active disease.

## 2023-06-06 DIAGNOSIS — E6609 Other obesity due to excess calories: Secondary | ICD-10-CM | POA: Diagnosis not present

## 2023-06-06 DIAGNOSIS — I10 Essential (primary) hypertension: Secondary | ICD-10-CM | POA: Diagnosis not present

## 2023-06-06 DIAGNOSIS — K219 Gastro-esophageal reflux disease without esophagitis: Secondary | ICD-10-CM | POA: Diagnosis not present

## 2023-06-06 DIAGNOSIS — F3342 Major depressive disorder, recurrent, in full remission: Secondary | ICD-10-CM | POA: Diagnosis not present

## 2023-06-06 DIAGNOSIS — Z7689 Persons encountering health services in other specified circumstances: Secondary | ICD-10-CM | POA: Diagnosis not present

## 2023-06-06 DIAGNOSIS — E785 Hyperlipidemia, unspecified: Secondary | ICD-10-CM | POA: Diagnosis not present

## 2023-06-06 DIAGNOSIS — E1169 Type 2 diabetes mellitus with other specified complication: Secondary | ICD-10-CM | POA: Diagnosis not present

## 2023-06-06 DIAGNOSIS — E1159 Type 2 diabetes mellitus with other circulatory complications: Secondary | ICD-10-CM | POA: Diagnosis not present

## 2023-06-06 DIAGNOSIS — E119 Type 2 diabetes mellitus without complications: Secondary | ICD-10-CM | POA: Diagnosis not present

## 2023-06-06 DIAGNOSIS — M19031 Primary osteoarthritis, right wrist: Secondary | ICD-10-CM | POA: Diagnosis not present

## 2023-06-06 DIAGNOSIS — I152 Hypertension secondary to endocrine disorders: Secondary | ICD-10-CM | POA: Diagnosis not present

## 2023-06-06 DIAGNOSIS — E66811 Obesity, class 1: Secondary | ICD-10-CM | POA: Diagnosis not present

## 2023-08-01 DIAGNOSIS — E1169 Type 2 diabetes mellitus with other specified complication: Secondary | ICD-10-CM | POA: Diagnosis not present

## 2023-08-01 DIAGNOSIS — I10 Essential (primary) hypertension: Secondary | ICD-10-CM | POA: Diagnosis not present

## 2023-08-01 DIAGNOSIS — M1990 Unspecified osteoarthritis, unspecified site: Secondary | ICD-10-CM | POA: Diagnosis not present

## 2023-08-01 DIAGNOSIS — F3342 Major depressive disorder, recurrent, in full remission: Secondary | ICD-10-CM | POA: Diagnosis not present

## 2023-08-01 DIAGNOSIS — K219 Gastro-esophageal reflux disease without esophagitis: Secondary | ICD-10-CM | POA: Diagnosis not present

## 2023-08-01 DIAGNOSIS — E1142 Type 2 diabetes mellitus with diabetic polyneuropathy: Secondary | ICD-10-CM | POA: Diagnosis not present

## 2023-08-01 DIAGNOSIS — E785 Hyperlipidemia, unspecified: Secondary | ICD-10-CM | POA: Diagnosis not present

## 2023-08-01 DIAGNOSIS — G8929 Other chronic pain: Secondary | ICD-10-CM | POA: Diagnosis not present

## 2023-08-01 DIAGNOSIS — E669 Obesity, unspecified: Secondary | ICD-10-CM | POA: Diagnosis not present

## 2023-08-01 DIAGNOSIS — E1165 Type 2 diabetes mellitus with hyperglycemia: Secondary | ICD-10-CM | POA: Diagnosis not present

## 2023-09-06 DIAGNOSIS — E6609 Other obesity due to excess calories: Secondary | ICD-10-CM | POA: Diagnosis not present

## 2023-09-06 DIAGNOSIS — I152 Hypertension secondary to endocrine disorders: Secondary | ICD-10-CM | POA: Diagnosis not present

## 2023-09-06 DIAGNOSIS — E785 Hyperlipidemia, unspecified: Secondary | ICD-10-CM | POA: Diagnosis not present

## 2023-09-06 DIAGNOSIS — E119 Type 2 diabetes mellitus without complications: Secondary | ICD-10-CM | POA: Diagnosis not present

## 2023-09-06 DIAGNOSIS — E1169 Type 2 diabetes mellitus with other specified complication: Secondary | ICD-10-CM | POA: Diagnosis not present

## 2023-09-06 DIAGNOSIS — E66811 Obesity, class 1: Secondary | ICD-10-CM | POA: Diagnosis not present

## 2023-09-06 DIAGNOSIS — Z6833 Body mass index (BMI) 33.0-33.9, adult: Secondary | ICD-10-CM | POA: Diagnosis not present

## 2023-09-06 DIAGNOSIS — E1159 Type 2 diabetes mellitus with other circulatory complications: Secondary | ICD-10-CM | POA: Diagnosis not present

## 2023-11-30 DIAGNOSIS — M65342 Trigger finger, left ring finger: Secondary | ICD-10-CM | POA: Diagnosis not present

## 2023-11-30 DIAGNOSIS — M2550 Pain in unspecified joint: Secondary | ICD-10-CM | POA: Diagnosis not present

## 2023-11-30 DIAGNOSIS — M19032 Primary osteoarthritis, left wrist: Secondary | ICD-10-CM | POA: Diagnosis not present

## 2023-11-30 DIAGNOSIS — G8929 Other chronic pain: Secondary | ICD-10-CM | POA: Diagnosis not present

## 2023-11-30 DIAGNOSIS — M180 Bilateral primary osteoarthritis of first carpometacarpal joints: Secondary | ICD-10-CM | POA: Diagnosis not present

## 2023-11-30 DIAGNOSIS — M19031 Primary osteoarthritis, right wrist: Secondary | ICD-10-CM | POA: Diagnosis not present

## 2023-12-14 DIAGNOSIS — M19032 Primary osteoarthritis, left wrist: Secondary | ICD-10-CM | POA: Diagnosis not present

## 2024-02-29 DIAGNOSIS — E119 Type 2 diabetes mellitus without complications: Secondary | ICD-10-CM | POA: Diagnosis not present

## 2024-02-29 DIAGNOSIS — Z0000 Encounter for general adult medical examination without abnormal findings: Secondary | ICD-10-CM | POA: Diagnosis not present

## 2024-03-07 DIAGNOSIS — K219 Gastro-esophageal reflux disease without esophagitis: Secondary | ICD-10-CM | POA: Diagnosis not present

## 2024-03-07 DIAGNOSIS — I152 Hypertension secondary to endocrine disorders: Secondary | ICD-10-CM | POA: Diagnosis not present

## 2024-03-07 DIAGNOSIS — E785 Hyperlipidemia, unspecified: Secondary | ICD-10-CM | POA: Diagnosis not present

## 2024-03-07 DIAGNOSIS — Z1331 Encounter for screening for depression: Secondary | ICD-10-CM | POA: Diagnosis not present

## 2024-03-07 DIAGNOSIS — E1169 Type 2 diabetes mellitus with other specified complication: Secondary | ICD-10-CM | POA: Diagnosis not present

## 2024-03-07 DIAGNOSIS — Z23 Encounter for immunization: Secondary | ICD-10-CM | POA: Diagnosis not present

## 2024-03-07 DIAGNOSIS — Z125 Encounter for screening for malignant neoplasm of prostate: Secondary | ICD-10-CM | POA: Diagnosis not present

## 2024-03-07 DIAGNOSIS — E1159 Type 2 diabetes mellitus with other circulatory complications: Secondary | ICD-10-CM | POA: Diagnosis not present

## 2024-03-07 DIAGNOSIS — E119 Type 2 diabetes mellitus without complications: Secondary | ICD-10-CM | POA: Diagnosis not present

## 2024-03-07 DIAGNOSIS — Z0000 Encounter for general adult medical examination without abnormal findings: Secondary | ICD-10-CM | POA: Diagnosis not present
